# Patient Record
Sex: Female | Born: 1980 | Race: Black or African American | Hispanic: No | Marital: Single | Smoking: Never smoker
Health system: Southern US, Community
[De-identification: ages and names within clinical notes are randomized; demographics above are authoritative.]

## PROBLEM LIST (undated history)

## (undated) DIAGNOSIS — D259 Leiomyoma of uterus, unspecified: Secondary | ICD-10-CM

## (undated) DIAGNOSIS — E7800 Pure hypercholesterolemia, unspecified: Secondary | ICD-10-CM

## (undated) DIAGNOSIS — R200 Anesthesia of skin: Secondary | ICD-10-CM

## (undated) HISTORY — DX: Anesthesia of skin: R20.0

## (undated) HISTORY — DX: Leiomyoma of uterus, unspecified: D25.9

## (undated) HISTORY — PX: FOOT SURGERY: SHX648

---

## 2005-08-02 HISTORY — PX: OOPHORECTOMY: SHX86

## 2009-03-15 ENCOUNTER — Emergency Department (HOSPITAL_COMMUNITY): Admission: EM | Admit: 2009-03-15 | Discharge: 2009-03-15 | Payer: Self-pay | Admitting: Emergency Medicine

## 2009-04-21 ENCOUNTER — Other Ambulatory Visit: Admission: RE | Admit: 2009-04-21 | Discharge: 2009-04-21 | Payer: Self-pay | Admitting: Family Medicine

## 2014-11-17 ENCOUNTER — Other Ambulatory Visit: Payer: Self-pay | Admitting: Family Medicine

## 2014-11-19 ENCOUNTER — Other Ambulatory Visit: Payer: Self-pay | Admitting: Family Medicine

## 2014-11-19 DIAGNOSIS — N63 Unspecified lump in unspecified breast: Secondary | ICD-10-CM

## 2014-11-24 ENCOUNTER — Ambulatory Visit
Admission: RE | Admit: 2014-11-24 | Discharge: 2014-11-24 | Disposition: A | Discharge status: Home / Self Care | Payer: BLUE CROSS/BLUE SHIELD | Source: Ambulatory Visit | Attending: Family Medicine | Admitting: Family Medicine

## 2014-11-24 DIAGNOSIS — N63 Unspecified lump in unspecified breast: Secondary | ICD-10-CM

## 2018-03-13 ENCOUNTER — Other Ambulatory Visit: Payer: Self-pay | Admitting: Obstetrics and Gynecology

## 2018-03-18 NOTE — Patient Instructions (Signed)
Elizabeth Hartman  03/18/2018   Your procedure is scheduled on: 03-24-18   Report to [facility] Main  Entrance  Report to admitting at       1100 AM    Call this number if you have problems the morning of surgery [phone]   Remember: Do not eat food or drink liquids :After Midnight. BRUSH YOUR TEETH MORNING OF SURGERY AND RINSE YOUR MOUTH OUT, NO CHEWING GUM CANDY OR MINTS.     Take these medicines the morning of surgery with A SIP OF WATER: NONE                                You may not have any metal on your body including hair pins and              piercings  Do not wear jewelry, make-up, lotions, powders or perfumes, deodorant             Do not wear nail polish.  Do not shave  48 hours prior to surgery.             Do not bring valuables to the hospital. [place].  Contacts, dentures or bridgework may not be worn into surgery.     Patients discharged the day of surgery will not be allowed to drive home.  Name and phone number of your driver:  Special Instructions: N/A              Please read over the following fact sheets you were given: _____________________________________________________________________           [facility] - Preparing for Surgery Before surgery, you can play an important role.  Because skin is not sterile, your skin needs to be as free of germs as possible.  You can reduce the number of germs on your skin by washing with CHG (chlorahexidine gluconate) soap before surgery.  CHG is an antiseptic cleaner which kills germs and bonds with the skin to continue killing germs even after washing. Please DO NOT use if you have an allergy to CHG or antibacterial soaps.  If your skin becomes reddened/irritated stop using the CHG and inform your nurse when you arrive at Short Stay. Do not shave (including legs and underarms) for at least 48 hours prior to the first CHG shower.  You may shave  your face/neck. Please follow these instructions carefully:  1.  Shower with CHG Soap the night before surgery and the  morning of Surgery.  2.  If you choose to wash your hair, wash your hair first as usual with your  normal  shampoo.  3.  After you shampoo, rinse your hair and body thoroughly to remove the  shampoo.                           4.  Use CHG as you would any other liquid soap.  You can apply chg directly  to the skin and wash                       Gently with a scrungie or clean washcloth.  5.  Apply the CHG Soap to your body  ONLY FROM THE NECK DOWN.   Do not use on face/ open                           Wound or open sores. Avoid contact with eyes, ears mouth and genitals (private parts).                       Wash face,  Genitals (private parts) with your normal soap.             6.  Wash thoroughly, paying special attention to the area where your surgery  will be performed.  7.  Thoroughly rinse your body with warm water from the neck down.  8.  DO NOT shower/wash with your normal soap after using and rinsing off  the CHG Soap.                9.  Pat yourself dry with a clean towel.            10.  Wear clean pajamas.            11.  Place clean sheets on your bed the night of your first shower and do not  sleep with pets. Day of Surgery : Do not apply any lotions/deodorants the morning of surgery.  Please wear clean clothes to the hospital/surgery center.  FAILURE TO FOLLOW THESE INSTRUCTIONS MAY RESULT IN THE CANCELLATION OF YOUR SURGERY PATIENT SIGNATURE_________________________________  NURSE SIGNATURE__________________________________  ________________________________________________________________________

## 2018-03-21 ENCOUNTER — Inpatient Hospital Stay (HOSPITAL_COMMUNITY)
Admission: RE | Admit: 2018-03-21 | Discharge: 2018-03-21 | Disposition: A | Discharge status: Home / Self Care | Payer: BLUE CROSS/BLUE SHIELD | Source: Ambulatory Visit

## 2018-05-04 HISTORY — PX: OTHER SURGICAL HISTORY: SHX169

## 2018-05-13 ENCOUNTER — Ambulatory Visit (HOSPITAL_BASED_OUTPATIENT_CLINIC_OR_DEPARTMENT_OTHER)
Admission: RE | Admit: 2018-05-13 | Payer: BC Managed Care – PPO | Source: Ambulatory Visit | Admitting: Obstetrics and Gynecology

## 2018-05-13 ENCOUNTER — Encounter (HOSPITAL_BASED_OUTPATIENT_CLINIC_OR_DEPARTMENT_OTHER): Admission: RE | Payer: Self-pay | Source: Ambulatory Visit

## 2018-05-13 SURGERY — EXCISION, CYST, OVARY, ROBOT-ASSISTED, LAPAROSCOPIC
Anesthesia: General | Laterality: Left

## 2018-07-05 HISTORY — PX: OTHER SURGICAL HISTORY: SHX169

## 2019-04-09 ENCOUNTER — Ambulatory Visit: Payer: BC Managed Care – PPO | Admitting: Neurology

## 2019-04-09 ENCOUNTER — Encounter: Payer: Self-pay | Admitting: Neurology

## 2019-04-09 ENCOUNTER — Other Ambulatory Visit: Payer: Self-pay

## 2019-04-09 VITALS — BP 128/83 | HR 90 | Temp 97.9°F | Ht 66.75 in | Wt 151.5 lb

## 2019-04-09 DIAGNOSIS — R202 Paresthesia of skin: Secondary | ICD-10-CM | POA: Insufficient documentation

## 2019-04-09 NOTE — Progress Notes (Signed)
PATIENT: Elizabeth Hartman DOB: 1981-03-31  Chief Complaint  Patient presents with  . Numbness    Reports low back and abdominal numbness since having surgery on 07/22/2018 (robotic laparoscopic myomectomy).       Carlyon Shadow, MD - referring provider  . PCP    Elizabeth Salina, MD     HISTORICAL  Janetzy Barut Hartman is a 38 year old female, seen in request by her OB/GYN physician Dr. Garwin Hartman, [facility] for evaluation of numbness at abdomen, lower back, initial evaluation was on April 09, 2019.  I have reviewed and summarized the referring note from the referring physician.  She had a history of robotic laparoscopic myomectomy on July 22, 2018 under general anesthesia, per patient, it was a difficult procedure, she had a positive surgical lower abdominal pain for extended period of time, when she woke up from anesthesia, she noticed numbness at her lower abdomen, extending to low back, which has been persistent since then, when she ran her hand over her lower back, she felt decreased sensation, but she denies low back pain, no radiating pain to bilateral lower extremity, she denies bowel bladder incontinence, no perineal area sensation change, she denies gait abnormality  Over the past few months, with decreased sensation at lower abdomen, and the lower back has been persistent, she denies pain, no limitation in her function  REVIEW OF SYSTEMS: Full 14 system review of systems performed and notable only for as above All other review of systems were negative.  ALLERGIES: No Known Allergies  HOME MEDICATIONS: Current Outpatient Medications  Medication Sig Dispense Refill  . Multiple Vitamins-Minerals (MULTIVITAMIN PO) Take 1 tablet by mouth daily.     No current facility-administered medications for this visit.     PAST MEDICAL HISTORY: Past Medical History:  Diagnosis Date  . Numbness   . Uterine fibroid     PAST SURGICAL HISTORY: Past  Surgical History:  Procedure Laterality Date  . egg retrieval surgery  05/2018  . FOOT SURGERY Bilateral    during high school  . myomectomy  07/2018  . OOPHORECTOMY Right 08/2005    FAMILY HISTORY: Family History  Problem Relation Age of Onset  . Hypertension Mother   . Stroke Father     SOCIAL HISTORY: Social History   Socioeconomic History  . Marital status: Single    Spouse name: Not on file  . Number of children: 0  . Years of education: college  . Highest education level: Master's degree (e.g., MA, MS, MEng, MEd, MSW, MBA)  Occupational History  . Occupation: Surveyor, quantity of communications  Social Needs  . Financial resource strain: Not on file  . Food insecurity    Worry: Not on file    Inability: Not on file  . Transportation needs    Medical: Not on file    Non-medical: Not on file  Tobacco Use  . Smoking status: Never Smoker  . Smokeless tobacco: Never Used  Substance and Sexual Activity  . Alcohol use: Never    Frequency: Never  . Drug use: Never  . Sexual activity: Not on file  Lifestyle  . Physical activity    Days per week: Not on file    Minutes per session: Not on file  . Stress: Not on file  Relationships  . Social Herbalist on phone: Not on file    Gets together: Not on file    Attends religious service: Not on file  Active member of club or organization: Not on file    Attends meetings of clubs or organizations: Not on file    Relationship status: Not on file  . Intimate partner violence    Fear of current or ex partner: Not on file    Emotionally abused: Not on file    Physically abused: Not on file    Forced sexual activity: Not on file  Other Topics Concern  . Not on file  Social History Narrative   Lives alone.   Right-handed.   No daily caffeine daily.     PHYSICAL EXAM   Vitals:   04/09/19 1340  BP: 128/83  Pulse: 90  Temp: 97.9 F (36.6 C)  Weight: 151 lb 8 oz (68.7 kg)  Height: 5' 6.75" (1.695  m)    Not recorded      Body mass index is 23.91 kg/m.  PHYSICAL EXAMNIATION:  Gen: NAD, conversant, well nourised, well groomed                     Cardiovascular: Regular rate rhythm, no peripheral edema, warm, nontender. Eyes: Conjunctivae clear without exudates or hemorrhage Neck: Supple, no carotid bruits. Pulmonary: Clear to auscultation bilaterally   NEUROLOGICAL EXAM:  MENTAL STATUS: Speech:    Speech is normal; fluent and spontaneous with normal comprehension.  Cognition:     Orientation to time, place and person     Normal recent and remote memory     Normal Attention span and concentration     Normal Language, naming, repeating,spontaneous speech     Fund of knowledge   CRANIAL NERVES: CN II: Visual fields are full to confrontation.  Pupils are round equal and briskly reactive to light. CN III, IV, VI: extraocular movement are normal. No ptosis. CN V: Facial sensation is intact to pinprick in all 3 divisions bilaterally. Corneal responses are intact.  CN VII: Face is symmetric with normal eye closure and smile. CN VIII: Hearing is normal to causal conversation. CN IX, X: Palate elevates symmetrically. Phonation is normal. CN XI: Head turning and shoulder shrug are intact CN XII: Tongue is midline with normal movements and no atrophy.  MOTOR: There is no pronator drift of out-stretched arms. Muscle bulk and tone are normal. Muscle strength is normal.  REFLEXES: Reflexes are 2+ and symmetric at the biceps, triceps, knees, and ankles. Plantar responses are flexor.  SENSORY: Intact to light touch, pinprick, positional sensation and vibratory sensation are intact in fingers and toes.  COORDINATION: Rapid alternating movements and fine finger movements are intact. There is no dysmetria on finger-to-nose and heel-knee-shin.    GAIT/STANCE: Posture is normal. Gait is steady with normal steps, base, arm swing, and turning. Heel and toe walking are normal. Tandem  gait is normal.  Romberg is absent.   DIAGNOSTIC DATA (LABS, IMAGING, TESTING) - I reviewed patient records, labs, notes, testing and imaging myself where available.   ASSESSMENT AND PLAN  [name] is a 38 y.o. female   Lower abdomen, lower back subjective decreased sensation following robotic assistant laparoscopic myomectomy in February 2020.  Essentially normal examination, other than subjective decreased sensation at lower abdomen, lower back,  This is likely related to the interruption of peripheral nerve branch from L1, L2 dermatomes that is related to her surgical incision.  I had extensive discussed with patient, she has no pain, there is no limitation in her functional status, we decided to continue to observe her symptoms, she will call  clinic for worsening symptoms.  Marcial Pacas, M.D. Ph.D.  [facility] [address], [place], [place] [address] Ph: [phone] Fax: [phone]  CC: Elizabeth Salina, MD

## 2020-06-09 ENCOUNTER — Other Ambulatory Visit: Payer: Self-pay

## 2020-06-09 ENCOUNTER — Encounter (HOSPITAL_COMMUNITY): Payer: Self-pay

## 2020-06-09 ENCOUNTER — Ambulatory Visit (HOSPITAL_COMMUNITY)
Admission: EM | Admit: 2020-06-09 | Discharge: 2020-06-09 | Disposition: A | Discharge status: Home / Self Care | Payer: BC Managed Care – PPO | Attending: Family Medicine | Admitting: Family Medicine

## 2020-06-09 DIAGNOSIS — J069 Acute upper respiratory infection, unspecified: Secondary | ICD-10-CM | POA: Diagnosis not present

## 2020-06-09 DIAGNOSIS — Z20822 Contact with and (suspected) exposure to covid-19: Secondary | ICD-10-CM | POA: Diagnosis not present

## 2020-06-09 DIAGNOSIS — Z90721 Acquired absence of ovaries, unilateral: Secondary | ICD-10-CM | POA: Insufficient documentation

## 2020-06-09 DIAGNOSIS — R509 Fever, unspecified: Secondary | ICD-10-CM | POA: Diagnosis present

## 2020-06-09 LAB — RESP PANEL BY RT-PCR (FLU A&B, COVID) ARPGX2
Influenza A by PCR: NEGATIVE
Influenza B by PCR: NEGATIVE
SARS Coronavirus 2 by RT PCR: NEGATIVE

## 2020-06-09 MED ORDER — LIDOCAINE VISCOUS HCL 2 % MT SOLN: 10.0000 mL | OROMUCOSAL | 0 refills | Status: DC | PRN

## 2020-06-09 NOTE — ED Notes (Signed)
Discharge instructions performed by this nurse

## 2020-06-09 NOTE — ED Notes (Signed)
Obtained nasal swab, labeled and placed in lab

## 2020-06-09 NOTE — ED Triage Notes (Addendum)
Pt is here with a sore throat and fever that started 2 days ago, pt has taken Tylenol to relieve discomfort. Pt states she had COVID testing yesterday and it was NEGATIVE.

## 2020-06-09 NOTE — ED Provider Notes (Signed)
[facility]    CSN: [id number] Arrival date & time: 06/09/20  0940      History   Chief Complaint Chief Complaint  Patient presents with  . Fever  . Sore Throat    HPI Elizabeth Hartman is a 40 y.o. female.   Here today with 2 day history of fever, sore throat, fatigue, chills. Denies cough, congestion, CP, SOB, abdominal pain, N/V/D. Taking tylenol prn with some relief. Tested for COVID yesterday which was negative. Recent COVID + contacts. No known chronic medical problems.      Past Medical History:  Diagnosis Date  . Numbness   . Uterine fibroid     Patient Active Problem List   Diagnosis Date Noted  . Paresthesia 04/09/2019    Past Surgical History:  Procedure Laterality Date  . egg retrieval surgery  05/2018  . FOOT SURGERY Bilateral    during high school  . myomectomy  07/2018  . OOPHORECTOMY Right 08/2005    OB History   No obstetric history on file.      Home Medications    Prior to Admission medications   Medication Sig Start Date End Date Taking? Authorizing Provider  lidocaine (XYLOCAINE) 2 % solution Use as directed 10 mLs in the mouth or throat as needed for mouth pain. 06/09/20  Yes Particia Nearing, PA-C  Multiple Vitamins-Minerals (MULTI FOR HER) TABS 2 Gummies    [provider]  Multiple Vitamins-Minerals (MULTIVITAMIN PO) Take 1 tablet by mouth daily.    [provider]    Family History Family History  Problem Relation Age of Onset  . Hypertension Mother   . Stroke Father     Social History Social History   Tobacco Use  . Smoking status: Never Smoker  . Smokeless tobacco: Never Used  Substance Use Topics  . Alcohol use: Not Currently  . Drug use: Never     Allergies   Patient has no known allergies.   Review of Systems Review of Systems PER HPI    Physical Exam Triage Vital Signs ED Triage Vitals  Enc Vitals Group     BP 06/09/20 1130 128/77     Pulse Rate 06/09/20  1130 67     Resp 06/09/20 1130 17     Temp 06/09/20 1130 99 F (37.2 C)     Temp Source 06/09/20 1130 Oral     SpO2 06/09/20 1130 100 %     Weight --      Height --      Head Circumference --      Peak Flow --      Pain Score 06/09/20 1128 0     Pain Loc --      Pain Edu? --      Excl. in GC? --    No data found.  Updated Vital Signs BP 128/77 (BP Location: Right Arm)   Pulse 67   Temp 99 F (37.2 C) (Oral)   Resp 17   LMP 05/18/2020   SpO2 100%   Visual Acuity Right Eye Distance:   Left Eye Distance:   Bilateral Distance:    Right Eye Near:   Left Eye Near:    Bilateral Near:     Physical Exam Vitals and nursing note reviewed.  Constitutional:      Appearance: Normal appearance. She is not ill-appearing.  HENT:     Head: Atraumatic.     Right Ear: Tympanic membrane normal.     Left  Ear: Tympanic membrane normal.     Nose: Nose normal.     Mouth/Throat:     Mouth: Mucous membranes are moist.     Pharynx: Posterior oropharyngeal erythema present.  Eyes:     Extraocular Movements: Extraocular movements intact.     Conjunctiva/sclera: Conjunctivae normal.  Cardiovascular:     Rate and Rhythm: Normal rate and regular rhythm.     Heart sounds: Normal heart sounds.  Pulmonary:     Effort: Pulmonary effort is normal. No respiratory distress.     Breath sounds: Normal breath sounds. No wheezing or rales.  Abdominal:     General: Bowel sounds are normal. There is no distension.     Palpations: Abdomen is soft.     Tenderness: There is no abdominal tenderness.  Musculoskeletal:        General: Normal range of motion.     Cervical back: Normal range of motion and neck supple.  Skin:    General: Skin is warm and dry.  Neurological:     Mental Status: She is alert and oriented to person, place, and time.  Psychiatric:        Mood and Affect: Mood normal.        Thought Content: Thought content normal.        Judgment: Judgment normal.      UC Treatments /  Results  Labs (all labs ordered are listed, but only abnormal results are displayed) Labs Reviewed  RESP PANEL BY RT-PCR (FLU A&B, COVID) [id number]    EKG   Radiology No results found.  Procedures Procedures (including critical care time)  Medications Ordered in UC Medications - No data to display  Initial Impression / Assessment and Plan / UC Course  I have reviewed the triage vital signs and the nursing notes.  Pertinent labs & imaging results that were available during my care of the patient were reviewed by me and considered in my medical decision making (see chart for details).     Vitals, exam reassuring. Resp panel pending, viscous lidocaine sent for comfort, OTC pain relievers, supportive home care reviewed. Work note given, instructed to isolate, return for worsening sxs.   Final Clinical Impressions(s) / UC Diagnoses   Final diagnoses:  Viral URI   Discharge Instructions   None    ED Prescriptions    Medication Sig Dispense Auth. Provider   lidocaine (XYLOCAINE) 2 % solution Use as directed 10 mLs in the mouth or throat as needed for mouth pain. 100 mL Particia Nearing, [place]     PDMP not reviewed this encounter.   Particia Nearing, [place] 06/09/20 1305

## 2020-11-07 ENCOUNTER — Ambulatory Visit: Payer: Self-pay | Admitting: Surgery

## 2020-11-07 DIAGNOSIS — D241 Benign neoplasm of right breast: Secondary | ICD-10-CM

## 2020-11-07 NOTE — H&P (Signed)
Elizabeth Hartman Appointment: 11/07/2020 9:10 AM Location: [facility] Patient #: [id number] DOB: [birth year] Single / Language: Elizabeth Hartman / Race: Black or African American Female  History of Present Illness Elizabeth Moores A. Illya Gienger MD; 11/07/2020 10:48 AM) Patient words: Patient presents for evaluation of right breast mass. She was seen earlier this year but had to postpone surgery due to work. She was worked up previously for the mass at in the last year. This was detected by ultrasound and mammography. Location is 9 o'clock. It was present back in 2016. Biopsy was done which shows fibroadenoma. She was sent at the request of [facility] mammography Dr. Mare Hartman. Patient denies breast pain, nipple discharge or change the appearance of either breast. There is no family history of breast cancer.  The patient is a 40 year old female.   Allergies Elizabeth Forehand, CNA; 11/07/2020 9:08 AM) No Known Drug Allergies [06/06/2020]: Allergies Reconciled  Medication History Elizabeth Forehand, CNA; 11/07/2020 9:08 AM) No Current Medications Medications Reconciled     Physical Exam (Elizabeth Hartman A. Alanson Hausmann MD; 11/07/2020 10:50 AM)  General Mental Status-Alert. General Appearance-Consistent with stated age. Hydration-Well hydrated. Voice-Normal.  Breast Note: Mobile 1 cm mass right breast upper quadrant. Left breast is normal.  Neurologic Neurologic evaluation reveals -alert and oriented x 3 with no impairment of recent or remote memory. Mental Status-Normal.    Assessment & Plan (Elizabeth Hartman A. Elizabeth Mccleod MD; 11/07/2020 10:50 AM)  BREAST FIBROADENOMA, RIGHT (D24.1) Impression: Discussed lumpectomy.  Patient would like to proceed with seed localized lumpectomy right  Risk of lumpectomy include bleeding, infection, seroma, more surgery, use of seed/wire, wound care, cosmetic deformity and the need for other treatments, death , blood clots, death. Pt agrees to proceed.    Total time 30  minutes  Current Plans You are being scheduled for surgery- Our schedulers will call you.  You should hear from our office's scheduling department within 5 working days about the location, date, and time of surgery. We try to make accommodations for patient's preferences in scheduling surgery, but sometimes the OR schedule or the surgeon's schedule prevents [place] from making those accommodations.  If you have not heard from our office [phone]) in 5 working days, call the office and ask for your surgeon's nurse.  If you have other questions about your diagnosis, plan, or surgery, call the office and ask for your surgeon's nurse.  Pt Education - [facility] Breast Biopsy HCI: discussed with patient and provided information.

## 2020-12-14 ENCOUNTER — Encounter (HOSPITAL_BASED_OUTPATIENT_CLINIC_OR_DEPARTMENT_OTHER): Payer: Self-pay | Admitting: Surgery

## 2020-12-14 ENCOUNTER — Other Ambulatory Visit: Payer: Self-pay

## 2020-12-20 ENCOUNTER — Encounter (HOSPITAL_BASED_OUTPATIENT_CLINIC_OR_DEPARTMENT_OTHER)
Admission: RE | Admit: 2020-12-20 | Discharge: 2020-12-20 | Disposition: A | Discharge status: Home / Self Care | Payer: Managed Care, Other (non HMO) | Source: Ambulatory Visit | Attending: Surgery | Admitting: Surgery

## 2020-12-20 DIAGNOSIS — Z01812 Encounter for preprocedural laboratory examination: Secondary | ICD-10-CM | POA: Diagnosis present

## 2020-12-20 DIAGNOSIS — D241 Benign neoplasm of right breast: Secondary | ICD-10-CM | POA: Diagnosis present

## 2020-12-20 LAB — CBC WITH DIFFERENTIAL/PLATELET
Abs Immature Granulocytes: 0.01 10*3/uL (ref 0.00–0.07)
Basophils Absolute: 0 10*3/uL (ref 0.0–0.1)
Basophils Relative: 0 %
Eosinophils Absolute: 0.2 10*3/uL (ref 0.0–0.5)
Eosinophils Relative: 3 %
HCT: 37 % (ref 36.0–46.0)
Hemoglobin: 13.1 g/dL (ref 12.0–15.0)
Immature Granulocytes: 0 %
Lymphocytes Relative: 47 %
Lymphs Abs: 3 10*3/uL (ref 0.7–4.0)
MCH: 32.2 pg (ref 26.0–34.0)
MCHC: 35.4 g/dL (ref 30.0–36.0)
MCV: 90.9 fL (ref 80.0–100.0)
Monocytes Absolute: 0.5 10*3/uL (ref 0.1–1.0)
Monocytes Relative: 8 %
Neutro Abs: 2.6 10*3/uL (ref 1.7–7.7)
Neutrophils Relative %: 42 %
Platelets: 226 10*3/uL (ref 150–400)
RBC: 4.07 MIL/uL (ref 3.87–5.11)
RDW: 12.1 % (ref 11.5–15.5)
WBC: 6.3 10*3/uL (ref 4.0–10.5)
nRBC: 0 % (ref 0.0–0.2)

## 2020-12-20 LAB — COMPREHENSIVE METABOLIC PANEL
ALT: 12 U/L (ref 0–44)
AST: 18 U/L (ref 15–41)
Albumin: 4 g/dL (ref 3.5–5.0)
Alkaline Phosphatase: 47 U/L (ref 38–126)
Anion gap: 5 (ref 5–15)
BUN: 7 mg/dL (ref 6–20)
CO2: 27 mmol/L (ref 22–32)
Calcium: 9.2 mg/dL (ref 8.9–10.3)
Chloride: 105 mmol/L (ref 98–111)
Creatinine, Ser: 0.87 mg/dL (ref 0.44–1.00)
GFR, Estimated: >60 mL/min (ref >60)
Glucose, Bld: 82 mg/dL (ref 70–99)
Potassium: 3.9 mmol/L (ref 3.5–5.1)
Sodium: 137 mmol/L (ref 135–145)
Total Bilirubin: 1.1 mg/dL (ref 0.3–1.2)
Total Protein: 6.9 g/dL (ref 6.5–8.1)

## 2020-12-20 LAB — POCT PREGNANCY, URINE: Preg Test, Ur: NEGATIVE

## 2020-12-20 NOTE — Progress Notes (Signed)

## 2020-12-21 ENCOUNTER — Ambulatory Visit (HOSPITAL_BASED_OUTPATIENT_CLINIC_OR_DEPARTMENT_OTHER): Payer: Managed Care, Other (non HMO) | Admitting: Anesthesiology

## 2020-12-21 ENCOUNTER — Encounter (HOSPITAL_BASED_OUTPATIENT_CLINIC_OR_DEPARTMENT_OTHER)
Admission: RE | Disposition: A | Discharge status: Home / Self Care | Payer: Self-pay | Source: Home / Self Care | Attending: Surgery

## 2020-12-21 ENCOUNTER — Ambulatory Visit (HOSPITAL_BASED_OUTPATIENT_CLINIC_OR_DEPARTMENT_OTHER)
Admission: RE | Admit: 2020-12-21 | Discharge: 2020-12-21 | Disposition: A | Discharge status: Home / Self Care | Payer: Managed Care, Other (non HMO) | Attending: Surgery | Admitting: Surgery

## 2020-12-21 ENCOUNTER — Encounter (HOSPITAL_BASED_OUTPATIENT_CLINIC_OR_DEPARTMENT_OTHER): Payer: Self-pay | Admitting: Surgery

## 2020-12-21 ENCOUNTER — Other Ambulatory Visit: Payer: Self-pay

## 2020-12-21 DIAGNOSIS — D241 Benign neoplasm of right breast: Secondary | ICD-10-CM | POA: Diagnosis not present

## 2020-12-21 HISTORY — PX: BREAST LUMPECTOMY WITH RADIOACTIVE SEED LOCALIZATION: SHX6424

## 2020-12-21 SURGERY — BREAST LUMPECTOMY WITH RADIOACTIVE SEED LOCALIZATION
Anesthesia: General | Site: Breast | Laterality: Right

## 2020-12-21 MED ORDER — LIDOCAINE HCL (PF) 2 % IJ SOLN
INTRAMUSCULAR | Status: AC
  Filled 2020-12-21: qty 5

## 2020-12-21 MED ORDER — FENTANYL CITRATE (PF) 100 MCG/2ML IJ SOLN
INTRAMUSCULAR | Status: AC
  Filled 2020-12-21: qty 2

## 2020-12-21 MED ORDER — MIDAZOLAM HCL 2 MG/2ML IJ SOLN
INTRAMUSCULAR | Status: AC
  Filled 2020-12-21: qty 2

## 2020-12-21 MED ORDER — OXYCODONE HCL 5 MG PO TABS: 5.0000 mg | ORAL_TABLET | Freq: Once | ORAL | Status: DC | PRN

## 2020-12-21 MED ORDER — BUPIVACAINE HCL 0.25 % IJ SOLN
INTRAMUSCULAR | Status: DC | PRN
  Administered 2020-12-21: 17 mL

## 2020-12-21 MED ORDER — PROPOFOL 10 MG/ML IV BOLUS
INTRAVENOUS | Status: DC | PRN
  Administered 2020-12-21: 200 mg via INTRAVENOUS

## 2020-12-21 MED ORDER — DEXAMETHASONE SODIUM PHOSPHATE 10 MG/ML IJ SOLN
INTRAMUSCULAR | Status: AC
  Filled 2020-12-21: qty 1

## 2020-12-21 MED ORDER — VANCOMYCIN HCL 500 MG IV SOLR
INTRAVENOUS | Status: DC | PRN
  Administered 2020-12-21: 500 mg via TOPICAL

## 2020-12-21 MED ORDER — LIDOCAINE 2% (20 MG/ML) 5 ML SYRINGE
INTRAMUSCULAR | Status: DC | PRN
  Administered 2020-12-21: 60 mg via INTRAVENOUS

## 2020-12-21 MED ORDER — CEFAZOLIN IN SODIUM CHLORIDE 3-0.9 GM/100ML-% IV SOLN
3.0000 g | INTRAVENOUS | Status: AC
  Administered 2020-12-21: 2 g via INTRAVENOUS

## 2020-12-21 MED ORDER — CHLORHEXIDINE GLUCONATE CLOTH 2 % EX PADS: 6.0000 | MEDICATED_PAD | Freq: Once | CUTANEOUS | Status: DC

## 2020-12-21 MED ORDER — DEXAMETHASONE SODIUM PHOSPHATE 4 MG/ML IJ SOLN
INTRAMUSCULAR | Status: DC | PRN
  Administered 2020-12-21: 5 mg via INTRAVENOUS

## 2020-12-21 MED ORDER — IBUPROFEN 800 MG PO TABS: 800.0000 mg | ORAL_TABLET | Freq: Three times a day (TID) | ORAL | 0 refills | Status: DC | PRN

## 2020-12-21 MED ORDER — PROPOFOL 500 MG/50ML IV EMUL
INTRAVENOUS | Status: AC
  Filled 2020-12-21: qty 50

## 2020-12-21 MED ORDER — MIDAZOLAM HCL 5 MG/5ML IJ SOLN
INTRAMUSCULAR | Status: DC | PRN
  Administered 2020-12-21: 2 mg via INTRAVENOUS

## 2020-12-21 MED ORDER — ONDANSETRON HCL 4 MG/2ML IJ SOLN: 4.0000 mg | Freq: Once | INTRAMUSCULAR | Status: DC | PRN

## 2020-12-21 MED ORDER — LACTATED RINGERS IV SOLN: INTRAVENOUS | Status: DC

## 2020-12-21 MED ORDER — ONDANSETRON HCL 4 MG/2ML IJ SOLN
INTRAMUSCULAR | Status: DC | PRN
  Administered 2020-12-21: 4 mg via INTRAVENOUS

## 2020-12-21 MED ORDER — FENTANYL CITRATE (PF) 100 MCG/2ML IJ SOLN
INTRAMUSCULAR | Status: DC | PRN
  Administered 2020-12-21: 25 ug via INTRAVENOUS
  Administered 2020-12-21: 50 ug via INTRAVENOUS
  Administered 2020-12-21: 25 ug via INTRAVENOUS

## 2020-12-21 MED ORDER — FENTANYL CITRATE (PF) 100 MCG/2ML IJ SOLN
25.0000 ug | INTRAMUSCULAR | Status: DC | PRN
  Administered 2020-12-21: 50 ug via INTRAVENOUS

## 2020-12-21 MED ORDER — ONDANSETRON HCL 4 MG/2ML IJ SOLN
INTRAMUSCULAR | Status: AC
  Filled 2020-12-21: qty 2

## 2020-12-21 MED ORDER — AMISULPRIDE (ANTIEMETIC) 5 MG/2ML IV SOLN: 10.0000 mg | Freq: Once | INTRAVENOUS | Status: DC | PRN

## 2020-12-21 MED ORDER — OXYCODONE HCL 5 MG/5ML PO SOLN
5.0000 mg | Freq: Once | ORAL | Status: DC | PRN
Start: 2020-12-21 — End: 2020-12-21

## 2020-12-21 MED ORDER — CEFAZOLIN SODIUM-DEXTROSE 2-4 GM/100ML-% IV SOLN
INTRAVENOUS | Status: AC
  Filled 2020-12-21: qty 100

## 2020-12-21 MED ORDER — HYDROCODONE-ACETAMINOPHEN 5-325 MG PO TABS: 1.0000 | ORAL_TABLET | Freq: Four times a day (QID) | ORAL | 0 refills | Status: DC | PRN

## 2020-12-21 SURGICAL SUPPLY — 50 items
ADH SKN CLS APL DERMABOND .7 (GAUZE/BANDAGES/DRESSINGS) ×1
APL PRP STRL LF DISP 70% ISPRP (MISCELLANEOUS) ×1
APPLIER CLIP 9.375 MED OPEN (MISCELLANEOUS)
APR CLP MED 9.3 20 MLT OPN (MISCELLANEOUS)
BINDER BREAST LRG (GAUZE/BANDAGES/DRESSINGS) IMPLANT
BINDER BREAST MEDIUM (GAUZE/BANDAGES/DRESSINGS) IMPLANT
BINDER BREAST XLRG (GAUZE/BANDAGES/DRESSINGS) IMPLANT
BINDER BREAST XXLRG (GAUZE/BANDAGES/DRESSINGS) IMPLANT
BLADE SURG 15 STRL LF DISP TIS (BLADE) ×1 IMPLANT
BLADE SURG 15 STRL SS (BLADE) ×2
CANISTER SUC SOCK COL 7IN (MISCELLANEOUS) IMPLANT
CANISTER SUCT 1200ML W/VALVE (MISCELLANEOUS) IMPLANT
CHLORAPREP W/TINT 26 (MISCELLANEOUS) ×2 IMPLANT
CLIP APPLIE 9.375 MED OPEN (MISCELLANEOUS) IMPLANT
COVER BACK TABLE 60X90IN (DRAPES) ×2 IMPLANT
COVER MAYO STAND STRL (DRAPES) ×2 IMPLANT
COVER PROBE W GEL 5X96 (DRAPES) ×2 IMPLANT
DECANTER SPIKE VIAL GLASS SM (MISCELLANEOUS) IMPLANT
DERMABOND ADVANCED (GAUZE/BANDAGES/DRESSINGS) ×1
DERMABOND ADVANCED .7 DNX12 (GAUZE/BANDAGES/DRESSINGS) ×1 IMPLANT
DRAPE LAPAROSCOPIC ABDOMINAL (DRAPES) IMPLANT
DRAPE LAPAROTOMY 100X72 PEDS (DRAPES) ×2 IMPLANT
DRAPE UTILITY XL STRL (DRAPES) ×2 IMPLANT
ELECT COATED BLADE 2.86 ST (ELECTRODE) ×2 IMPLANT
ELECT REM PT RETURN 9FT ADLT (ELECTROSURGICAL) ×2
ELECTRODE REM PT RTRN 9FT ADLT (ELECTROSURGICAL) ×1 IMPLANT
GLOVE SRG 8 PF TXTR STRL LF DI (GLOVE) ×1 IMPLANT
GLOVE SURG LTX SZ8 (GLOVE) ×2 IMPLANT
GLOVE SURG UNDER POLY LF SZ8 (GLOVE) ×2
GOWN STRL REUS W/ TWL LRG LVL3 (GOWN DISPOSABLE) ×2 IMPLANT
GOWN STRL REUS W/ TWL XL LVL3 (GOWN DISPOSABLE) ×1 IMPLANT
GOWN STRL REUS W/TWL LRG LVL3 (GOWN DISPOSABLE) ×4
GOWN STRL REUS W/TWL XL LVL3 (GOWN DISPOSABLE) ×2
HEMOSTAT ARISTA ABSORB 3G PWDR (HEMOSTASIS) IMPLANT
HEMOSTAT SNOW SURGICEL 2X4 (HEMOSTASIS) IMPLANT
KIT MARKER MARGIN INK (KITS) ×2 IMPLANT
NEEDLE HYPO 25X1 1.5 SAFETY (NEEDLE) ×2 IMPLANT
NS IRRIG 1000ML POUR BTL (IV SOLUTION) ×2 IMPLANT
PACK BASIN DAY SURGERY FS (CUSTOM PROCEDURE TRAY) ×2 IMPLANT
PENCIL SMOKE EVACUATOR (MISCELLANEOUS) ×2 IMPLANT
SLEEVE SCD COMPRESS KNEE MED (STOCKING) ×2 IMPLANT
SPONGE T-LAP 4X18 ~~LOC~~+RFID (SPONGE) ×2 IMPLANT
SUT MNCRL AB 4-0 PS2 18 (SUTURE) ×2 IMPLANT
SUT SILK 2 0 SH (SUTURE) IMPLANT
SUT VICRYL 3-0 CR8 SH (SUTURE) ×2 IMPLANT
SYR CONTROL 10ML LL (SYRINGE) ×2 IMPLANT
TOWEL GREEN STERILE FF (TOWEL DISPOSABLE) ×2 IMPLANT
TRAY FAXITRON CT DISP (TRAY / TRAY PROCEDURE) ×2 IMPLANT
TUBE CONNECTING 20X1/4 (TUBING) IMPLANT
YANKAUER SUCT BULB TIP NO VENT (SUCTIONS) IMPLANT

## 2020-12-21 NOTE — Discharge Instructions (Addendum)
[facility] Office Phone Number [phone]  BREAST BIOPSY/ PARTIAL MASTECTOMY: POST OP INSTRUCTIONS  Always review your discharge instruction sheet given to you by the facility where your surgery was performed.  IF YOU HAVE DISABILITY OR FAMILY LEAVE FORMS, YOU MUST BRING THEM TO THE OFFICE FOR PROCESSING.  DO NOT GIVE THEM TO YOUR DOCTOR.  A prescription for pain medication may be given to you upon discharge.  Take your pain medication as prescribed, if needed.  If narcotic pain medicine is not needed, then you may take acetaminophen (Tylenol) or ibuprofen (Advil) as needed. Take your usually prescribed medications unless otherwise directed If you need a refill on your pain medication, please contact your pharmacy.  They will contact our office to request authorization.  Prescriptions will not be filled after 5pm or on week-ends. You should eat very light the first 24 hours after surgery, such as soup, crackers, pudding, etc.  Resume your normal diet the day after surgery. Most patients will experience some swelling and bruising in the breast.  Ice packs and a good support bra will help.  Swelling and bruising can take several days to resolve.  It is common to experience some constipation if taking pain medication after surgery.  Increasing fluid intake and taking a stool softener will usually help or prevent this problem from occurring.  A mild laxative (Milk of Magnesia or Miralax) should be taken according to package directions if there are no bowel movements after 48 hours. Unless discharge instructions indicate otherwise, you may remove your bandages 24-48 hours after surgery, and you may shower at that time.  You may have steri-strips (small skin tapes) in place directly over the incision.  These strips should be left on the skin for 7-10 days.  If your surgeon used skin glue on the incision, you may shower in 24 hours.  The glue will flake off over the next 2-3 weeks.  Any  sutures or staples will be removed at the office during your follow-up visit. ACTIVITIES:  You may resume regular daily activities (gradually increasing) beginning the next day.  Wearing a good support bra or sports bra minimizes pain and swelling.  You may have sexual intercourse when it is comfortable. You may drive when you no longer are taking prescription pain medication, you can comfortably wear a seatbelt, and you can safely maneuver your car and apply brakes. RETURN TO WORK:  ______________________________________________________________________________________ You should see your doctor in the office for a follow-up appointment approximately two weeks after your surgery.  Your doctor's nurse will typically make your follow-up appointment when she calls you with your pathology report.  Expect your pathology report 2-3 business days after your surgery.  You may call to check if you do not hear from us after three days. OTHER INSTRUCTIONS: _______________________________________________________________________________________________ _____________________________________________________________________________________________________________________________________ _____________________________________________________________________________________________________________________________________ _____________________________________________________________________________________________________________________________________  WHEN TO CALL YOUR DOCTOR: Fever over 101.0 Nausea and/or vomiting. Extreme swelling or bruising. Continued bleeding from incision. Increased pain, redness, or drainage from the incision.  The clinic staff is available to answer your questions during regular business hours.  Please don't hesitate to call and ask to speak to one of the nurses for clinical concerns.  If you have a medical emergency, go to the nearest emergency room or call 911.  A surgeon from [facility] is always on call at the hospital.  For further questions, please visit [email or web address]    Post Anesthesia Home Care Instructions  Activity: Get plenty of rest for the remainder of   the day. A responsible individual must stay with you for 24 hours following the procedure.  For the next 24 hours, DO NOT: -Drive a car -Operate machinery -Drink alcoholic beverages -Take any medication unless instructed by your physician -Make any legal decisions or sign important papers.  Meals: Start with liquid foods such as gelatin or soup. Progress to regular foods as tolerated. Avoid greasy, spicy, heavy foods. If nausea and/or vomiting occur, drink only clear liquids until the nausea and/or vomiting subsides. Call your physician if vomiting continues.  Special Instructions/Symptoms: Your throat may feel dry or sore from the anesthesia or the breathing tube placed in your throat during surgery. If this causes discomfort, gargle with warm salt water. The discomfort should disappear within 24 hours.  If you had a scopolamine patch placed behind your ear for the management of post- operative nausea and/or vomiting:  1. The medication in the patch is effective for 72 hours, after which it should be removed.  Wrap patch in a tissue and discard in the trash. Wash hands thoroughly with soap and water. 2. You may remove the patch earlier than 72 hours if you experience unpleasant side effects which may include dry mouth, dizziness or visual disturbances. 3. Avoid touching the patch. Wash your hands with soap and water after contact with the patch.      

## 2020-12-21 NOTE — Anesthesia Postprocedure Evaluation (Signed)
Anesthesia Post Note  Patient: Engineer, drilling  Procedure(s) Performed: RIGHT BREAST LUMPECTOMY WITH RADIOACTIVE SEED LOCALIZATION (Right: Breast)     Patient location during evaluation: PACU Anesthesia Type: General Level of consciousness: awake and alert Pain management: pain level controlled Vital Signs Assessment: post-procedure vital signs reviewed and stable Respiratory status: spontaneous breathing, nonlabored ventilation and respiratory function stable Cardiovascular status: blood pressure returned to baseline and stable Postop Assessment: no apparent nausea or vomiting Anesthetic complications: no   No notable events documented.  Last Vitals:  Vitals:   12/21/20 1030 12/21/20 1116  BP: 115/74 123/87  Pulse: (!) 55 (!) 58  Resp: 13 14  Temp:  37.2 C  SpO2: 97% 98%    Last Pain:  Vitals:   12/21/20 1040  TempSrc:   PainSc: 2                  Lidia Collum

## 2020-12-21 NOTE — Anesthesia Preprocedure Evaluation (Signed)
Anesthesia Evaluation  Patient identified by MRN, date of birth, ID band Patient awake    Reviewed: Allergy & Precautions, NPO status , Patient's Chart, lab work & pertinent test results  History of Anesthesia Complications Negative for: history of anesthetic complications  Airway Mallampati: II  TM Distance: >3 FB Neck ROM: Full    Dental  (+) Teeth Intact, Dental Advisory Given   Pulmonary neg pulmonary ROS,    Pulmonary exam normal        Cardiovascular negative cardio ROS Normal cardiovascular exam     Neuro/Psych negative neurological ROS     GI/Hepatic negative GI ROS, Neg liver ROS,   Endo/Other  negative endocrine ROS  Renal/GU negative Renal ROS  negative genitourinary   Musculoskeletal negative musculoskeletal ROS (+)   Abdominal   Peds  Hematology negative hematology ROS (+)   Anesthesia Other Findings  R breast fibroadenoma  Reproductive/Obstetrics Uterine fibroids                             Anesthesia Physical Anesthesia Plan  ASA: 1  Anesthesia Plan: General   Post-op Pain Management:    Induction: Intravenous  PONV Risk Score and Plan: 3 and Ondansetron, Dexamethasone, Midazolam and Treatment may vary due to age or medical condition  Airway Management Planned: LMA  Additional Equipment: None  Intra-op Plan:   Post-operative Plan: Extubation in OR  Informed Consent: I have reviewed the patients History and Physical, chart, labs and discussed the procedure including the risks, benefits and alternatives for the proposed anesthesia with the patient or authorized representative who has indicated his/her understanding and acceptance.     Dental advisory given  Plan Discussed with:   Anesthesia Plan Comments:         Anesthesia Quick Evaluation

## 2020-12-21 NOTE — Transfer of Care (Signed)
Immediate Anesthesia Transfer of Care Note  Patient: Elizabeth Hartman  Procedure(s) Performed: RIGHT BREAST LUMPECTOMY WITH RADIOACTIVE SEED LOCALIZATION (Right: Breast)  Patient Location: PACU  Anesthesia Type:General  Level of Consciousness: sedated  Airway & Oxygen Therapy: Patient Spontanous Breathing and Patient connected to face mask oxygen  Post-op Assessment: Report given to RN and Post -op Vital signs reviewed and stable  Post vital signs: Reviewed and stable  Last Vitals:  Vitals Value Taken Time  BP 113/75 12/21/20 0941  Temp    Pulse 60 12/21/20 0943  Resp 11 12/21/20 0943  SpO2 100 % 12/21/20 0943  Vitals shown include unvalidated device data.  Last Pain:  Vitals:   12/21/20 0730  TempSrc: Oral  PainSc: 0-No pain      Patients Stated Pain Goal: 4 (63/81/77 1165)  Complications: No notable events documented.

## 2020-12-21 NOTE — Interval H&P Note (Signed)
History and Physical Interval Note:  12/21/2020 8:25 AM  Elizabeth Hartman  has presented today for surgery, with the diagnosis of FIBROADENOMA RIGHT BREAST MASS.  The various methods of treatment have been discussed with the patient and family. After consideration of risks, benefits and other options for treatment, the patient has consented to  Procedure(s): RIGHT BREAST LUMPECTOMY WITH RADIOACTIVE SEED LOCALIZATION (Right) as a surgical intervention.  The patient's history has been reviewed, patient examined, no change in status, stable for surgery.  I have reviewed the patient's chart and labs.  Questions were answered to the patient's satisfaction.     [name]

## 2020-12-21 NOTE — Op Note (Signed)
Preoperative diagnosis: Right breast fibroadenoma  Postoperative diagnosis: Same  Procedure: Right breast seed localized lumpectomy  Surgeon: Erroll Luna, MD  Anesthesia: LMA with 0.25% Marcaine plain  EBL: Minimal  Specimen: Right breast tissue with seed sent separately from the clip.  Mass was noted.  Photographs taken.  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  This is causing pain and getting larger.  She presents for removal.  Risks and benefits of surgery were discussed.  The procedure has been discussed with the patient. Alternatives to surgery have been discussed with the patient.  Risks of surgery include bleeding,  Infection,  Seroma formation, death, cosmesis and the need for further surgery.   The patient understands and wishes to proceed.     Description of procedure: The patient was met in the holding area and questions were answered.  Right breast was marked as the correct site neoprobe used to verify seed right breast upper outer quadrant.  After induction of general esthesia she was prepped and draped in a sterile fashion.  She was supine on the OR table.  Timeout was performed.  She received appropriate preoperative antibiotics.  Neoprobe used to identify the seed right breast upper outer quadrant.  Curvilinear incision made along the lateral border the nipple-areolar complex.  Dissection was carried down and all tissue around the mass was excised.  Of note the seed was separate from the fibroadenoma and this was sent out separately.  Faxitron revealed the mass to be present with a clip present.  Irrigation was used.  Hemostasis achieved.  Vancomycin powder placed.  Local anesthetic was infiltrated throughout the lumpectomy cavity.  The incision was then closed with a deep layer 3-0 Vicryl and 4 Monocryl.  Dermabond applied.  All counts were  found to be correct.  The patient was awoke extubated taken to recovery in satisfactory condition.

## 2020-12-21 NOTE — Anesthesia Procedure Notes (Signed)
Procedure Name: LMA Insertion Date/Time: 12/21/2020 8:34 AM Performed by: Maryella Shivers, CRNA Pre-anesthesia Checklist: Patient identified, Emergency Drugs available, Suction available and Patient being monitored Patient Re-evaluated:Patient Re-evaluated prior to induction Oxygen Delivery Method: Circle system utilized Preoxygenation: Pre-oxygenation with 100% oxygen Induction Type: IV induction Ventilation: Mask ventilation without difficulty LMA: LMA inserted LMA Size: 4.0 Number of attempts: 1 Airway Equipment and Method: Bite block Placement Confirmation: positive ETCO2 Tube secured with: Tape Dental Injury: Teeth and Oropharynx as per pre-operative assessment

## 2020-12-21 NOTE — H&P (Signed)
Elizabeth Hartman  Location: [facility] Patient #: [id number] DOB: 1981-04-22 Single / Language: [name] / Race: Black or African American Female   History of Present Illness ( Patient words: Patient presents for evaluation of right breast mass.  She was seen earlier this year but had to postpone surgery due to work.  She was worked up previously for the mass at in the last year. This was detected by ultrasound and mammography.  Location is 9  o'clock.  It was present back in 2016.  Biopsy was done which shows fibroadenoma.  She was sent at the request of [facility] mammography Dr. [name].  Patient denies breast pain, nipple discharge or change the appearance of either breast.  There is no family history of breast cancer.   The patient is a 40 year old female.     Allergies (No Known Drug Allergies    Allergies Reconciled    Medication History (No Current Medications  Medications Reconciled          Physical Exam    General Mental Status - Alert. General Appearance - Consistent with stated age. Hydration - Well hydrated. Voice - Normal.   Breast Note:  Mobile 1 cm mass right breast upper quadrant. Left breast is normal.   Neurologic Neurologic evaluation reveals  - alert and oriented x 3 with no impairment of recent or remote memory. Mental Status - Normal.       Assessment & Plan    BREAST FIBROADENOMA, RIGHT (D24.1) Impression: Discussed lumpectomy.   Patient would like to proceed with seed localized lumpectomy right   Risk of lumpectomy include bleeding, infection, seroma, more surgery, use of seed/wire, wound care, cosmetic deformity and the need for other treatments, death , blood clots, death. Pt agrees to proceed.       Total time 30 minutes   Current Plans You are being scheduled for surgery - Our schedulers will call you.    You should hear from our office's scheduling department within 5 working days about the location, date, and time of surgery.   We try to make accommodations for patient's preferences in scheduling surgery, but sometimes the OR schedule or the surgeon's schedule prevents [place] from making those accommodations.   If you have not heard from our office [phone]) in 5 working days, call the office and ask for your surgeon's nurse.   If you have other questions about your diagnosis, plan, or surgery, call the office and ask for your surgeon's nurse.   Pt Education - [facility] Breast Biopsy HCI: discussed with patient and provided information.

## 2020-12-22 ENCOUNTER — Encounter (HOSPITAL_BASED_OUTPATIENT_CLINIC_OR_DEPARTMENT_OTHER): Payer: Self-pay | Admitting: Surgery

## 2020-12-22 LAB — SURGICAL PATHOLOGY

## 2020-12-23 ENCOUNTER — Encounter: Payer: Self-pay | Admitting: Surgery

## 2021-07-09 ENCOUNTER — Emergency Department (HOSPITAL_BASED_OUTPATIENT_CLINIC_OR_DEPARTMENT_OTHER)
Admission: EM | Admit: 2021-07-09 | Discharge: 2021-07-09 | Disposition: A | Discharge status: Home / Self Care | Payer: Managed Care, Other (non HMO) | Attending: Emergency Medicine | Admitting: Emergency Medicine

## 2021-07-09 ENCOUNTER — Other Ambulatory Visit: Payer: Self-pay

## 2021-07-09 ENCOUNTER — Encounter (HOSPITAL_BASED_OUTPATIENT_CLINIC_OR_DEPARTMENT_OTHER): Payer: Self-pay

## 2021-07-09 ENCOUNTER — Emergency Department (HOSPITAL_BASED_OUTPATIENT_CLINIC_OR_DEPARTMENT_OTHER): Payer: Managed Care, Other (non HMO) | Admitting: Radiology

## 2021-07-09 DIAGNOSIS — R072 Precordial pain: Secondary | ICD-10-CM | POA: Insufficient documentation

## 2021-07-09 DIAGNOSIS — R0781 Pleurodynia: Secondary | ICD-10-CM

## 2021-07-09 DIAGNOSIS — R079 Chest pain, unspecified: Secondary | ICD-10-CM | POA: Diagnosis present

## 2021-07-09 HISTORY — DX: Pure hypercholesterolemia, unspecified: E78.00

## 2021-07-09 LAB — CBC
HCT: 33.6 % — ABNORMAL LOW (ref 36.0–46.0)
Hemoglobin: 11.8 g/dL — ABNORMAL LOW (ref 12.0–15.0)
MCH: 31.6 pg (ref 26.0–34.0)
MCHC: 35.1 g/dL (ref 30.0–36.0)
MCV: 89.8 fL (ref 80.0–100.0)
Platelets: 227 10*3/uL (ref 150–400)
RBC: 3.74 MIL/uL — ABNORMAL LOW (ref 3.87–5.11)
RDW: 11.9 % (ref 11.5–15.5)
WBC: 6.1 10*3/uL (ref 4.0–10.5)
nRBC: 0 % (ref 0.0–0.2)

## 2021-07-09 LAB — BASIC METABOLIC PANEL
Anion gap: 8 (ref 5–15)
BUN: 13 mg/dL (ref 6–20)
CO2: 25 mmol/L (ref 22–32)
Calcium: 8.8 mg/dL — ABNORMAL LOW (ref 8.9–10.3)
Chloride: 107 mmol/L (ref 98–111)
Creatinine, Ser: 0.8 mg/dL (ref 0.44–1.00)
GFR, Estimated: >60 mL/min (ref >60)
Glucose, Bld: 92 mg/dL (ref 70–99)
Potassium: 3.7 mmol/L (ref 3.5–5.1)
Sodium: 140 mmol/L (ref 135–145)

## 2021-07-09 LAB — D-DIMER, QUANTITATIVE: D-Dimer, Quant: <0.27 ug/mL-FEU (ref 0.00–0.50)

## 2021-07-09 LAB — PREGNANCY, URINE: Preg Test, Ur: NEGATIVE

## 2021-07-09 LAB — TROPONIN I (HIGH SENSITIVITY): Troponin I (High Sensitivity): <2 ng/L (ref <18)

## 2021-07-09 MED ORDER — IBUPROFEN 800 MG PO TABS: 800.0000 mg | ORAL_TABLET | Freq: Three times a day (TID) | ORAL | 0 refills | Status: DC | PRN

## 2021-07-09 MED ORDER — IBUPROFEN 800 MG PO TABS
800.0000 mg | ORAL_TABLET | Freq: Once | ORAL | Status: AC
Start: 2021-07-09 — End: 2021-07-09
  Administered 2021-07-09: 800 mg via ORAL
  Filled 2021-07-09: qty 1

## 2021-07-09 NOTE — ED Provider Notes (Signed)
Emergency Department Provider Note   I have reviewed the triage vital signs and the nursing notes.   HISTORY  Chief Complaint Chest Pain   HPI Elizabeth Hartman is a 41 y.o. female with PMH reviewed presents to the emergency department for evaluation of intermittent sharp right-sided chest pain.  Symptoms began yesterday evening and have persisted throughout the day today.  She has occasional pain, at least once every hour, which is brief.  Pain is sharp/stabbing and last for 1 to 2 seconds and then resolve spontaneously.  She denies any pain with deep breathing.  She is not feeling subjectively short of breath.  No pain with exertion.  She did have a lumpectomy on that side in the summer 2022 but the lesion was benign.  No recent surgery or travel.  No recent illness. No leg swelling or pain.    Past Medical History:  Diagnosis Date   Elevated cholesterol    Numbness    Uterine fibroid     Review of Systems  Constitutional: No fever/chills Eyes: No visual changes. ENT: No sore throat. Cardiovascular: Positive chest pain. Respiratory: Denies shortness of breath. Gastrointestinal: No abdominal pain.  No nausea, no vomiting.  No diarrhea.  No constipation. Genitourinary: Negative for dysuria. Musculoskeletal: Negative for back pain. Skin: Negative for rash. Neurological: Negative for headaches, focal weakness or numbness.   ____________________________________________   PHYSICAL EXAM:  VITAL SIGNS: ED Triage Vitals  Enc Vitals Group     BP 07/09/21 2013 133/83     Pulse Rate 07/09/21 2013 78     Resp 07/09/21 2013 14     Temp 07/09/21 2013 98.8 F (37.1 C)     Temp Source 07/09/21 2013 Oral     SpO2 07/09/21 2013 99 %     Weight 07/09/21 2014 150 lb (68 kg)     Height 07/09/21 2014 5\' 7"  (1.702 m)   Constitutional: Alert and oriented. Well appearing and in no acute distress. Eyes: Conjunctivae are normal. Head: Atraumatic. Nose: No  congestion/rhinnorhea. Mouth/Throat: Mucous membranes are moist.  Neck: No stridor.  Cardiovascular: Normal rate, regular rhythm. Good peripheral circulation. Grossly normal heart sounds.   Respiratory: Normal respiratory effort.  No retractions. Lungs CTAB. Gastrointestinal: Soft and nontender. No distention.  Musculoskeletal: No lower extremity tenderness nor edema. No gross deformities of extremities. No chest wall tenderness.  Neurologic:  Normal speech and language. No gross focal neurologic deficits are appreciated.  Skin:  Skin is warm, dry and intact. No rash noted.   ____________________________________________   LABS (all labs ordered are listed, but only abnormal results are displayed)  Labs Reviewed  BASIC METABOLIC PANEL - Abnormal; Notable for the following components:      Result Value   Calcium 8.8 (*)    All other components within normal limits  CBC - Abnormal; Notable for the following components:   RBC 3.74 (*)    Hemoglobin 11.8 (*)    HCT 33.6 (*)    All other components within normal limits  PREGNANCY, URINE  D-DIMER, QUANTITATIVE (NOT AT [facility])  TROPONIN I (HIGH SENSITIVITY)   ____________________________________________  EKG   EKG Interpretation  Date/Time:  Sunday July 09 2021 20:14:47 EST Ventricular Rate:  78 PR Interval:  144 QRS Duration: 84 QT Interval:  386 QTC Calculation: 440 R Axis:   82 Text Interpretation: Sinus rhythm Confirmed by Nanda Quinton [phone]) on 07/09/2021 8:23:21 PM        ____________________________________________  RADIOLOGY  DG Chest  2 View  Result Date: 07/09/2021 CLINICAL DATA:  Acute chest pain. EXAM: CHEST - 2 VIEW COMPARISON:  None. FINDINGS: The cardiomediastinal silhouette is unremarkable. There is no evidence of focal airspace disease, pulmonary edema, suspicious pulmonary nodule/mass, pleural effusion, or pneumothorax. No acute bony abnormalities are identified. IMPRESSION: No active cardiopulmonary  disease. Electronically Signed   By: Margarette Canada M.D.   On: 07/09/2021 20:51    ____________________________________________   PROCEDURES  Procedure(s) performed:   Procedures  None ____________________________________________   INITIAL IMPRESSION / ASSESSMENT AND PLAN / ED COURSE  Pertinent labs & imaging results that were available during my care of the patient were reviewed by me and considered in my medical decision making (see chart for details).   This patient is Presenting for Evaluation of CP, which does require a range of treatment options, and is a complaint that involves a high risk of morbidity and mortality.  The Differential Diagnoses includes all life-threatening causes for chest pain. This includes but is not exclusive to acute coronary syndrome, aortic dissection, pulmonary embolism, cardiac tamponade, community-acquired pneumonia, pericarditis, musculoskeletal chest wall pain, etc.   Critical Interventions- Motrin   Medications  ibuprofen (ADVIL) tablet 800 mg (has no administration in time range)    Reassessment after intervention: Remains HDS. Occasional symptoms without provocation.    I decided to review pertinent External Data, and in summary no recent ED visits or prior cardiac history.   Clinical Laboratory Tests Ordered, included CBC without leukocytosis.  Mild anemia noted.  No acute kidney injury.  Electrolytes are normal.  Troponin and D-dimer are normal.   Radiologic Tests Ordered, included CXR. I independently interpreted the images and agree with radiology interpretation.   Cardiac Monitor Tracing which shows NSR. Normal rate.    Social Determinants of Health Risk patient is not a smoker.   Medical Decision Making: Summary:  Patient presents the emergency department with sharp, intermittent chest discomfort without clear provocation.  No reproducible tenderness to palpation of the chest wall.  No tenderness in the right upper quadrant of the  abdomen to suggest acute cholecystitis or other intra-abdominal pathology.  Seems most consistent with pleurisy.  Patient's D-dimer and troponin are normal.  EKG interpreted by me as above.  Chest x-ray is clear.  Plan for anti-inflammatory medication and PCP follow-up.  Discussed tricked ED return precautions.  Reevaluation with update and discussion with patient and Mom at bedside. Discussed results and treatment plan.   Disposition: discharge  ____________________________________________  FINAL CLINICAL IMPRESSION(S) / ED DIAGNOSES  Final diagnoses:  Pleuritic chest pain     NEW OUTPATIENT MEDICATIONS STARTED DURING THIS VISIT:  New Prescriptions   IBUPROFEN (ADVIL) 800 MG TABLET    Take 1 tablet (800 mg total) by mouth every 8 (eight) hours as needed for moderate pain.    Note:  This document was prepared using Dragon voice recognition software and may include unintentional dictation errors.  Nanda Quinton, MD, [facility] Emergency Medicine    Jae Bruck, [facility], MD 07/09/21 2221

## 2021-07-09 NOTE — Discharge Instructions (Signed)
You are seen in the emergency room today with sharp chest pain.  I am treating you with anti-inflammatory medication.  I would expect this to gradually improve but it may take several days.  If you chest pain worsens or he develops shortness of breath, fever, other sudden/severe symptoms you should return to the emergency department for reevaluation.  Also consider if you develop abdominal pain or vomiting should return.  Please follow close with your primary care doctor.

## 2021-07-09 NOTE — ED Triage Notes (Signed)
Pt arrives POV with c/o of chest pain under her right breast.  She states she had a right breast lumpectomy in July, 2022.  She went bowling last night and the pain started afterwards.

## 2021-09-12 ENCOUNTER — Encounter (HOSPITAL_COMMUNITY): Payer: Self-pay

## 2022-05-14 ENCOUNTER — Other Ambulatory Visit: Payer: Self-pay

## 2022-05-14 ENCOUNTER — Encounter (HOSPITAL_COMMUNITY): Payer: Self-pay | Admitting: *Deleted

## 2022-05-14 ENCOUNTER — Ambulatory Visit (HOSPITAL_COMMUNITY)
Admission: EM | Admit: 2022-05-14 | Discharge: 2022-05-14 | Disposition: A | Discharge status: Home / Self Care | Payer: Managed Care, Other (non HMO) | Attending: Family Medicine | Admitting: Family Medicine

## 2022-05-14 ENCOUNTER — Ambulatory Visit (INDEPENDENT_AMBULATORY_CARE_PROVIDER_SITE_OTHER): Payer: Managed Care, Other (non HMO)

## 2022-05-14 DIAGNOSIS — J069 Acute upper respiratory infection, unspecified: Secondary | ICD-10-CM

## 2022-05-14 DIAGNOSIS — R059 Cough, unspecified: Secondary | ICD-10-CM | POA: Diagnosis not present

## 2022-05-14 DIAGNOSIS — Z1152 Encounter for screening for COVID-19: Secondary | ICD-10-CM | POA: Diagnosis not present

## 2022-05-14 DIAGNOSIS — J4521 Mild intermittent asthma with (acute) exacerbation: Secondary | ICD-10-CM

## 2022-05-14 MED ORDER — PREDNISONE 20 MG PO TABS
40.0000 mg | ORAL_TABLET | Freq: Every day | ORAL | 0 refills | Status: AC
Start: 2022-05-14 — End: 2022-05-19

## 2022-05-14 MED ORDER — ALBUTEROL SULFATE HFA 108 (90 BASE) MCG/ACT IN AERS: 2.0000 | INHALATION_SPRAY | RESPIRATORY_TRACT | 0 refills | Status: AC | PRN

## 2022-05-14 NOTE — Discharge Instructions (Addendum)
Your chest x-ray was clear  Albuterol inhaler--do 2 puffs every 4 hours as needed for shortness of breath or wheezing  Take prednisone 20 mg--2 daily for 5 days   You have been swabbed for COVID, and the test will result in the next 24 hours. Our staff will call you if positive. If the COVID test is positive, you should quarantine for 5 days from the start of your symptoms

## 2022-05-14 NOTE — ED Provider Notes (Signed)
[facility]    CSN: [id number] Arrival date & time: 05/14/22  5732      History   Chief Complaint Chief Complaint  Patient presents with   Cough   Hemoptysis    HPI Elizabeth Hartman is a 41 y.o. female.    Cough  Here for cough and congestion that started on December 9.  She has not had any fever and temperature here is 99.  Your diarrhea or nausea.  She has had some aching.  Home COVID test was negative on December 9.  She has had a little bit of rhinorrhea and has had cough that is productive of some mucus with some blood in it.  She also has had some wheezing, and she has used an inhaler previously.  Last menstrual cycle was November 30.  Have an upper respiratory infection around Thanksgiving, around November 24, but that did resolve completely before the symptoms started  Past Medical History:  Diagnosis Date   Elevated cholesterol    Numbness    Uterine fibroid     Patient Active Problem List   Diagnosis Date Noted   Paresthesia 04/09/2019    Past Surgical History:  Procedure Laterality Date   BREAST LUMPECTOMY WITH RADIOACTIVE SEED LOCALIZATION Right 12/21/2020   Procedure: RIGHT BREAST LUMPECTOMY WITH RADIOACTIVE SEED LOCALIZATION;  Surgeon: Erroll Luna, MD;  Location: [facility];  Service: General;  Laterality: Right;   egg retrieval surgery  05/2018   FOOT SURGERY Bilateral    during high school   myomectomy  07/2018   OOPHORECTOMY Right 08/2005    OB History   No obstetric history on file.      Home Medications    Prior to Admission medications   Medication Sig Start Date End Date Taking? Authorizing Provider  albuterol (VENTOLIN HFA) 108 (90 Base) MCG/ACT inhaler Inhale 2 puffs into the lungs every 4 (four) hours as needed for wheezing or shortness of breath. 05/14/22  Yes Barrett Henle, MD  predniSONE (DELTASONE) 20 MG tablet Take 2 tablets (40 mg total) by mouth daily with breakfast for 5 days.  05/14/22 05/19/22 Yes Greenlee Ancheta, Gwenlyn Perking, MD  Multiple Vitamins-Minerals (MULTI FOR HER) TABS 2 Gummies    [provider]  Multiple Vitamins-Minerals (MULTIVITAMIN PO) Take 1 tablet by mouth daily.    [provider]    Family History Family History  Problem Relation Age of Onset   Hypertension Mother    Stroke Father     Social History Social History   Tobacco Use   Smoking status: Never   Smokeless tobacco: Never  Vaping Use   Vaping Use: Never used  Substance Use Topics   Alcohol use: Not Currently   Drug use: Never     Allergies   Patient has no known allergies.   Review of Systems Review of Systems  Respiratory:  Positive for cough.      Physical Exam Triage Vital Signs ED Triage Vitals  Enc Vitals Group     BP 05/14/22 1102 124/77     Pulse Rate 05/14/22 1102 (!) 55     Resp 05/14/22 1102 18     Temp 05/14/22 1102 99 F (37.2 C)     Temp src --      SpO2 05/14/22 1102 99 %     Weight --      Height --      Head Circumference --      Peak Flow --  Pain Score 05/14/22 1059 0     Pain Loc --      Pain Edu? --      Excl. in [place]? --    No data found.  Updated Vital Signs BP 124/77   Pulse (!) 55   Temp 99 F (37.2 C)   Resp 18   LMP 05/03/2022   SpO2 99%   Visual Acuity Right Eye Distance:   Left Eye Distance:   Bilateral Distance:    Right Eye Near:   Left Eye Near:    Bilateral Near:     Physical Exam Vitals reviewed.  Constitutional:      General: She is not in acute distress.    Appearance: She is not toxic-appearing.  HENT:     Right Ear: Tympanic membrane and ear canal normal.     Left Ear: Tympanic membrane and ear canal normal.     Nose: Congestion present.     Mouth/Throat:     Mouth: Mucous membranes are moist.     Comments: There is clear mucus draining; no erythema Eyes:     Extraocular Movements: Extraocular movements intact.     Conjunctiva/sclera: Conjunctivae normal.     Pupils: Pupils  are equal, round, and reactive to light.  Cardiovascular:     Rate and Rhythm: Normal rate and regular rhythm.     Heart sounds: No murmur heard. Pulmonary:     Effort: Pulmonary effort is normal. No respiratory distress.     Breath sounds: No stridor. No rales.     Comments: There are low pitched wheezes or rhonchi heard in all lung fields.  Air movement is good Chest:     Chest wall: No tenderness.  Musculoskeletal:     Cervical back: Neck supple.  Lymphadenopathy:     Cervical: No cervical adenopathy.  Skin:    Capillary Refill: Capillary refill takes less than 2 seconds.     Coloration: Skin is not jaundiced or pale.  Neurological:     General: No focal deficit present.     Mental Status: She is alert and oriented to person, place, and time.  Psychiatric:        Behavior: Behavior normal.      UC Treatments / Results  Labs (all labs ordered are listed, but only abnormal results are displayed) Labs Reviewed  SARS CORONAVIRUS 2 (TAT 6-24 HRS)    EKG   Radiology DG Chest 2 View  Result Date: 05/14/2022 CLINICAL DATA:  Cough for couple days, rhonchi, blood in mucus, hemoptysis EXAM: CHEST - 2 VIEW COMPARISON:  07/09/2021 FINDINGS: Normal heart size, mediastinal contours, and pulmonary vascularity. Lungs clear. No acute infiltrate, pleural effusion, or pneumothorax. Mild biconvex thoracic scoliosis. IMPRESSION: No acute abnormalities. Electronically Signed   By: Lavonia Dana M.D.   On: 05/14/2022 11:26    Procedures Procedures (including critical care time)  Medications Ordered in UC Medications - No data to display  Initial Impression / Assessment and Plan / UC Course  I have reviewed the triage vital signs and the nursing notes.  Pertinent labs & imaging results that were available during my care of the patient were reviewed by me and considered in my medical decision making (see chart for details).        CXR is clear.  Will treat for asthma exacerbation,  and we will go ahead and do a PCR COVID test to make sure that is not what is going on. If positive, she is a candidate  for Paxlovid; last eGFR was >60 Final Clinical Impressions(s) / UC Diagnoses   Final diagnoses:  Viral URI with cough  Mild intermittent asthma with exacerbation     Discharge Instructions      Your chest x-ray was clear  Albuterol inhaler--do 2 puffs every 4 hours as needed for shortness of breath or wheezing  Take prednisone 20 mg--2 daily for 5 days   You have been swabbed for COVID, and the test will result in the next 24 hours. Our staff will call you if positive. If the COVID test is positive, you should quarantine for 5 days from the start of your symptoms        ED Prescriptions     Medication Sig Dispense Auth. Provider   albuterol (VENTOLIN HFA) 108 (90 Base) MCG/ACT inhaler Inhale 2 puffs into the lungs every 4 (four) hours as needed for wheezing or shortness of breath. 1 each Barrett Henle, MD   predniSONE (DELTASONE) 20 MG tablet Take 2 tablets (40 mg total) by mouth daily with breakfast for 5 days. 10 tablet Windy Carina Gwenlyn Perking, MD      I have reviewed the PDMP during this encounter.   Barrett Henle, MD 05/14/22 [phone]

## 2022-05-14 NOTE — ED Triage Notes (Signed)
Pt reports cough a couple of days and has seen blood in mucous with cough.

## 2022-05-15 LAB — SARS CORONAVIRUS 2 (TAT 6-24 HRS): SARS Coronavirus 2: NEGATIVE

## 2023-05-31 IMAGING — DX DG CHEST 2V
2 series · 2 of 2 positions shown · non-contrast
Comparison: None.

CLINICAL DATA: Acute chest pain.

EXAM:
CHEST - 2 VIEW

[chest pa]
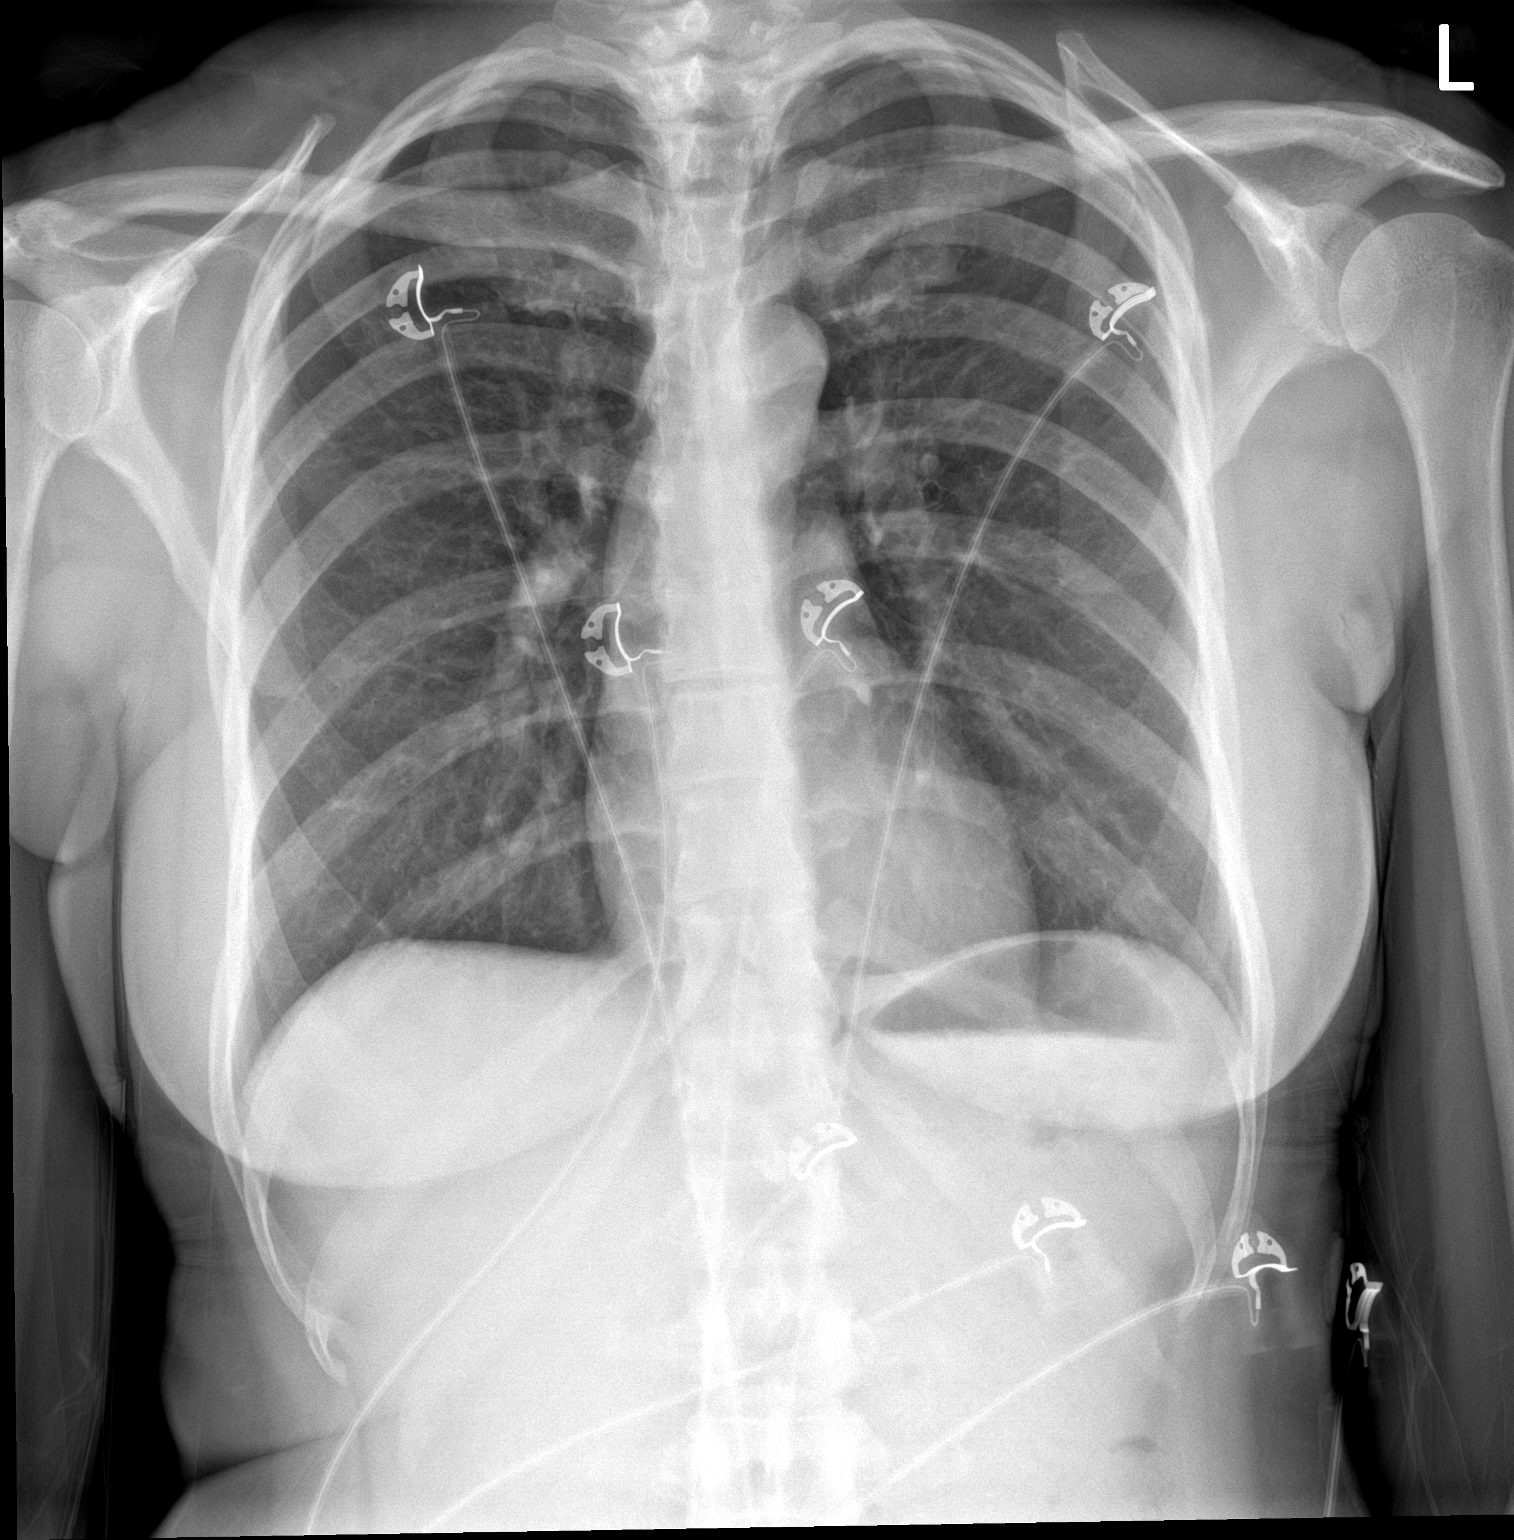

[chest lat]
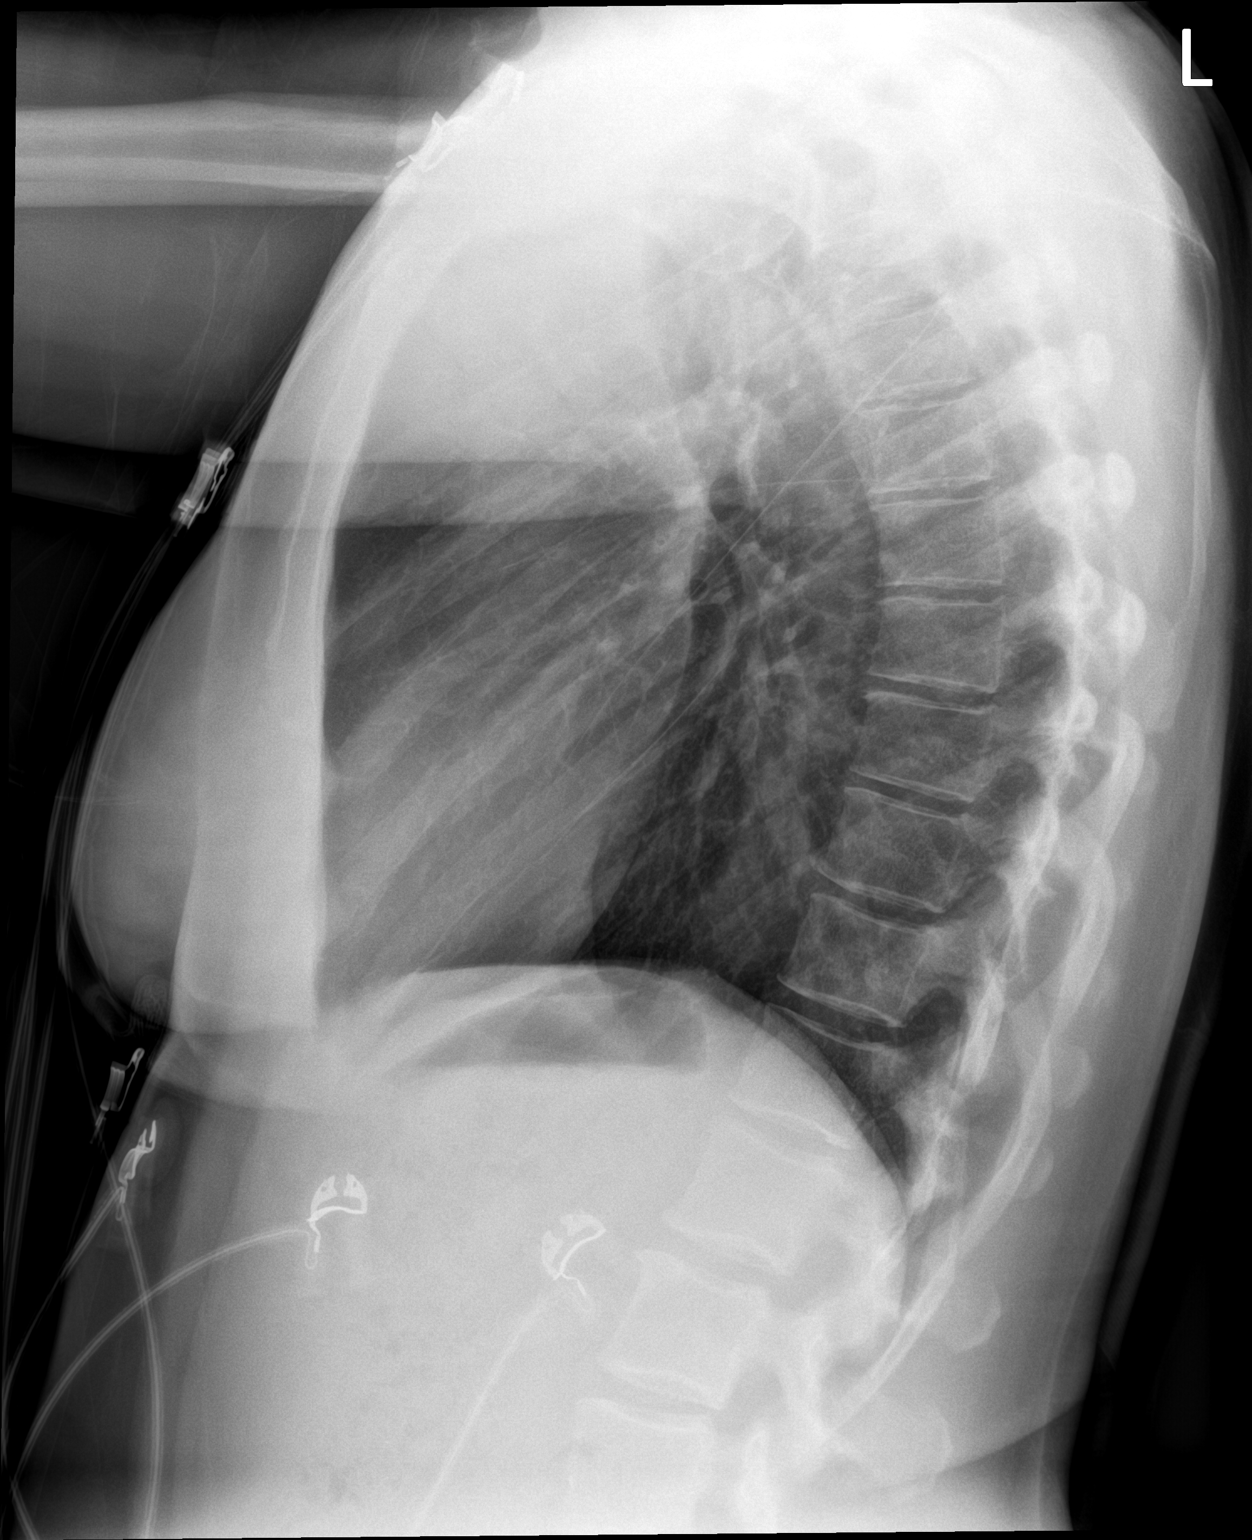

[2 of 2 positions shown; findings below may reference images not displayed]

FINDINGS: The cardiomediastinal silhouette is unremarkable.

There is no evidence of focal airspace disease, pulmonary edema,
suspicious pulmonary nodule/mass, pleural effusion, or pneumothorax.

No acute bony abnormalities are identified.
IMPRESSION: No active cardiopulmonary disease.
